# Patient Record
Sex: Female | Born: 1990 | Race: Black or African American | Hispanic: No | Smoking: Never smoker
Health system: Southern US, Community
[De-identification: ages and names within clinical notes are randomized; demographics above are authoritative.]

## PROBLEM LIST (undated history)

## (undated) ENCOUNTER — Emergency Department (HOSPITAL_BASED_OUTPATIENT_CLINIC_OR_DEPARTMENT_OTHER): Admission: EM | Payer: 59 | Source: Home / Self Care

## (undated) DIAGNOSIS — F419 Anxiety disorder, unspecified: Secondary | ICD-10-CM

## (undated) DIAGNOSIS — N730 Acute parametritis and pelvic cellulitis: Secondary | ICD-10-CM

## (undated) DIAGNOSIS — B009 Herpesviral infection, unspecified: Secondary | ICD-10-CM

## (undated) DIAGNOSIS — N83209 Unspecified ovarian cyst, unspecified side: Secondary | ICD-10-CM

## (undated) DIAGNOSIS — F32A Depression, unspecified: Secondary | ICD-10-CM

## (undated) HISTORY — PX: OVARIAN CYST SURGERY: SHX726

## (undated) HISTORY — PX: OOPHORECTOMY: SHX86

## (undated) HISTORY — PX: DILATION AND CURETTAGE OF UTERUS: SHX78

---

## 2021-02-23 ENCOUNTER — Other Ambulatory Visit: Payer: Self-pay

## 2021-02-23 ENCOUNTER — Encounter (HOSPITAL_BASED_OUTPATIENT_CLINIC_OR_DEPARTMENT_OTHER): Payer: Self-pay

## 2021-02-23 ENCOUNTER — Emergency Department (HOSPITAL_BASED_OUTPATIENT_CLINIC_OR_DEPARTMENT_OTHER)
Admission: EM | Admit: 2021-02-23 | Discharge: 2021-02-23 | Disposition: A | Discharge status: Home / Self Care | Payer: Medicaid Other | Attending: Emergency Medicine | Admitting: Emergency Medicine

## 2021-02-23 DIAGNOSIS — N760 Acute vaginitis: Secondary | ICD-10-CM | POA: Insufficient documentation

## 2021-02-23 DIAGNOSIS — T8332XA Displacement of intrauterine contraceptive device, initial encounter: Secondary | ICD-10-CM | POA: Diagnosis not present

## 2021-02-23 DIAGNOSIS — R102 Pelvic and perineal pain: Secondary | ICD-10-CM | POA: Diagnosis present

## 2021-02-23 HISTORY — DX: Unspecified ovarian cyst, unspecified side: N83.209

## 2021-02-23 HISTORY — DX: Acute parametritis and pelvic cellulitis: N73.0

## 2021-02-23 LAB — URINALYSIS, ROUTINE W REFLEX MICROSCOPIC
Bilirubin Urine: NEGATIVE
Glucose, UA: NEGATIVE mg/dL
Hgb urine dipstick: NEGATIVE
Ketones, ur: NEGATIVE mg/dL
Leukocytes,Ua: NEGATIVE
Nitrite: NEGATIVE
Protein, ur: NEGATIVE mg/dL
Specific Gravity, Urine: 1.015 (ref 1.005–1.030)
pH: 8 (ref 5.0–8.0)

## 2021-02-23 LAB — WET PREP, GENITAL
Clue Cells Wet Prep HPF POC: NONE SEEN
Sperm: NONE SEEN
Trich, Wet Prep: NONE SEEN
Yeast Wet Prep HPF POC: NONE SEEN

## 2021-02-23 LAB — PREGNANCY, URINE: Preg Test, Ur: NEGATIVE

## 2021-02-23 MED ORDER — KETOCONAZOLE 2 % EX CREA: TOPICAL_CREAM | CUTANEOUS | 0 refills | Status: AC

## 2021-02-23 MED ORDER — FLUCONAZOLE 150 MG PO TABS
150.0000 mg | ORAL_TABLET | Freq: Once | ORAL | Status: AC
  Administered 2021-02-23: 150 mg via ORAL
  Filled 2021-02-23: qty 1

## 2021-02-23 MED ORDER — METRONIDAZOLE 500 MG PO TABS: 500.0000 mg | ORAL_TABLET | Freq: Two times a day (BID) | ORAL | 0 refills | Status: DC

## 2021-02-23 NOTE — ED Provider Notes (Signed)
[facility] EMERGENCY DEPARTMENT Provider Note   CSN: [id number] Arrival date & time: 02/23/21  2010     History Chief Complaint  Patient presents with  . Vaginal Pain    Samantha Kent is a 30 y.o. female with a  Hx of PID, TOA  S/p oophorectomy who presents with vaginal pain. Patient states that  She and her partner were having intercourse yesterday. She noticed significant pain at the introitus especially when he removed his penis. She used a monistat insert today without relief. She denies pelvic pain, discharge or fever. She denies urinary sxs.  HPI     Past Medical History:  Diagnosis Date  . Ovarian cyst   . PID (acute pelvic inflammatory disease)     There are no problems to display for this patient.   Past Surgical History:  Procedure Laterality Date  . DILATION AND CURETTAGE OF UTERUS    . OOPHORECTOMY    . OVARIAN CYST SURGERY       OB History   No obstetric history on file.     No family history on file.  Social History   Tobacco Use  . Smoking status: Never Smoker  . Smokeless tobacco: Never Used  Substance Use Topics  . Alcohol use: Yes    Comment: occ  . Drug use: Never    Home Medications Prior to Admission medications   Medication Sig Start Date End Date Taking? Authorizing Provider  ketoconazole (NIZORAL) 2 % cream Apply topically to the vulva daily for 2 weeks 02/23/21  Yes Loran Fleet, PA-C  metroNIDAZOLE (FLAGYL) 500 MG tablet Take 1 tablet (500 mg total) by mouth 2 (two) times daily. 02/23/21  Yes Arthor Captain, PA-C    Allergies    Other  Review of Systems   Review of Systems Ten systems reviewed and are negative for acute change, except as noted in the HPI.   Physical Exam Updated Vital Signs BP (!) 113/59 (BP Location: Right Arm)   Pulse 88   Temp 98.4 F (36.9 C) (Oral)   Resp 16   Ht 5\' 4"  (1.626 m)   Wt 101.2 kg   SpO2 100%   BMI 38.30 kg/m   Physical Exam Vitals and nursing note reviewed. Exam  conducted with a chaperone present.  Constitutional:      General: She is not in acute distress.    Appearance: She is well-developed. She is not diaphoretic.  HENT:     Head: Normocephalic and atraumatic.  Eyes:     General: No scleral icterus.    Conjunctiva/sclera: Conjunctivae normal.  Cardiovascular:     Rate and Rhythm: Normal rate and regular rhythm.     Heart sounds: Normal heart sounds. No murmur heard. No friction rub. No gallop.   Pulmonary:     Effort: Pulmonary effort is normal. No respiratory distress.     Breath sounds: Normal breath sounds.  Abdominal:     General: Bowel sounds are normal. There is no distension.     Palpations: Abdomen is soft. There is no mass.     Tenderness: There is no abdominal tenderness. There is no guarding.  Genitourinary:    Exam position: Lithotomy position.     Labia:        Right: Tenderness present.        Left: Tenderness present.      Vagina: Vaginal discharge present.     Cervix: Discharge present. No cervical motion tenderness or cervical bleeding.  Adnexa: Right adnexa normal and left adnexa normal.     Comments: Irritation and swelling BL labia minora Introitus with swelling and erythema No visible strings in the cervix Musculoskeletal:     Cervical back: Normal range of motion.  Skin:    General: Skin is warm and dry.  Neurological:     Mental Status: She is alert and oriented to person, place, and time.  Psychiatric:        Behavior: Behavior normal.     ED Results / Procedures / Treatments   Labs (all labs ordered are listed, but only abnormal results are displayed) Labs Reviewed  WET PREP, GENITAL - Abnormal; Notable for the following components:      Result Value   WBC, Wet Prep HPF POC MANY (*)    All other components within normal limits  URINALYSIS, ROUTINE W REFLEX MICROSCOPIC - Abnormal; Notable for the following components:   APPearance CLOUDY (*)    All other components within normal limits   PREGNANCY, URINE  GC/CHLAMYDIA PROBE AMP ([place]) NOT AT [facility]    EKG None  Radiology No results found.  Procedures Procedures  Medications Ordered in ED Medications  fluconazole (DIFLUCAN) tablet 150 mg (has no administration in time range)    ED Course  I have reviewed the triage vital signs and the nursing notes.  Pertinent labs & imaging results that were available during my care of the patient were reviewed by me and considered in my medical decision making (see chart for details).    MDM Rules/Calculators/A&P                          Patient here with vaginitis. Will treat with diflucan, ketoconazole and flagyl. G/c chlamydia pending.  [place] ordered for IUD localization. Discussed return precautions and op f/u Final Clinical Impression(s) / ED Diagnoses Final diagnoses:  Acute vaginitis  Intrauterine contraceptive device threads lost, initial encounter    Rx / DC Orders ED Discharge Orders         Ordered    ketoconazole (NIZORAL) 2 % cream        02/23/21 2312    US Transvaginal Non-OB        02/23/21 2312    metroNIDAZOLE (FLAGYL) 500 MG tablet  2 times daily        02/23/21 2312           Arthor Captain, PA-C 02/23/21 2332    Terrilee Files, MD 02/24/21 1016

## 2021-02-23 NOTE — ED Triage Notes (Signed)
Pt c/o vaginal pain and swelling, painful intercourse-denies vaginal d/c-NAD-steady gait

## 2021-02-23 NOTE — Discharge Instructions (Signed)
You appear to have vaginitis. You will be treated for yeast vaginitis with a single dose of oral diflucan. 1 week of metronidazole (avoid alcohol with this medication) and topical ketoconazole cream. Please avoid inserting ANYTHING into you vagina for the next 2 weeks.  Please return tomorrow for an ultrasound to assess for you lost IUD strings. You may also call your OB gyn to have this done. Make sure to use a condom and back up birth control method until you have had your IUD located. You have been tested for gonorrhea and chlamydia as well. This result is a culture and takes 2 days to return. Contact a health care provider if: You have abdominal or pelvic pain. You have a fever or chills. You have symptoms that last for more than 2-3 days. Get help right away if: You have a fever and your symptoms suddenly get worse.

## 2021-02-24 ENCOUNTER — Ambulatory Visit (HOSPITAL_BASED_OUTPATIENT_CLINIC_OR_DEPARTMENT_OTHER)
Admission: RE | Admit: 2021-02-24 | Discharge: 2021-02-24 | Disposition: A | Discharge status: Home / Self Care | Payer: Medicaid Other | Source: Ambulatory Visit | Attending: Emergency Medicine | Admitting: Emergency Medicine

## 2021-02-24 DIAGNOSIS — Z30431 Encounter for routine checking of intrauterine contraceptive device: Secondary | ICD-10-CM | POA: Diagnosis not present

## 2021-02-27 LAB — GC/CHLAMYDIA PROBE AMP (~~LOC~~) NOT AT ARMC
Chlamydia: NEGATIVE
Comment: NEGATIVE
Comment: NORMAL
Neisseria Gonorrhea: NEGATIVE

## 2021-11-12 IMAGING — US US TRANSVAGINAL NON-OB
1 series · 14 of 25 positions shown · non-contrast
Comparison: None.

CLINICAL DATA: Missing IUD string.  Pelvic pressure.

EXAM:
ULTRASOUND PELVIS TRANSVAGINAL
TECHNIQUE: Transvaginal ultrasound examination of the pelvis was performed
including evaluation of the uterus, ovaries, adnexal regions, and
pelvic cul-de-sac.

[Series 1: us transvaginal non-ob · 39 acquisitions, 14 frames shown]
[im 1/39]
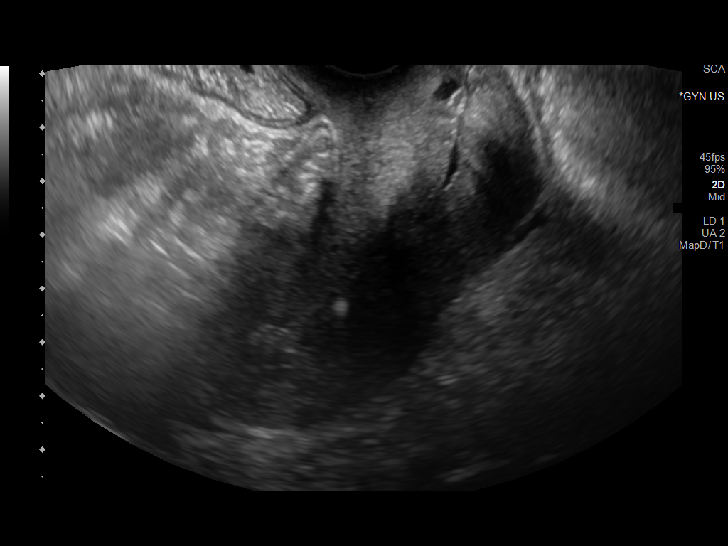
[im 4/39]
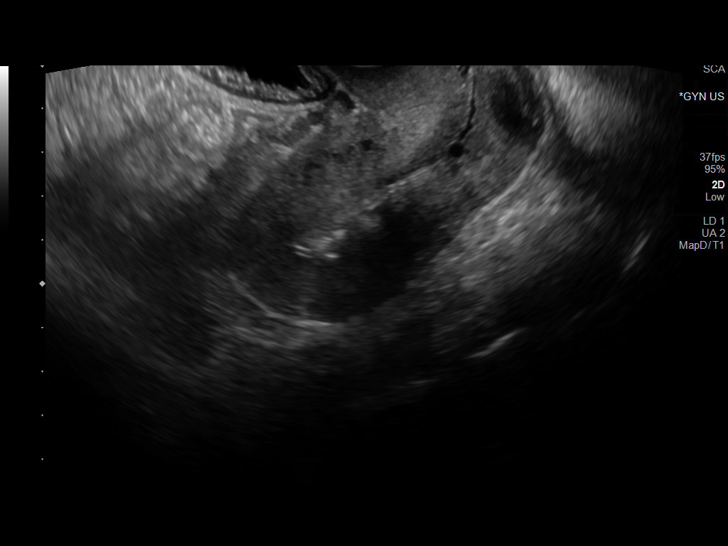
[im 7/39]
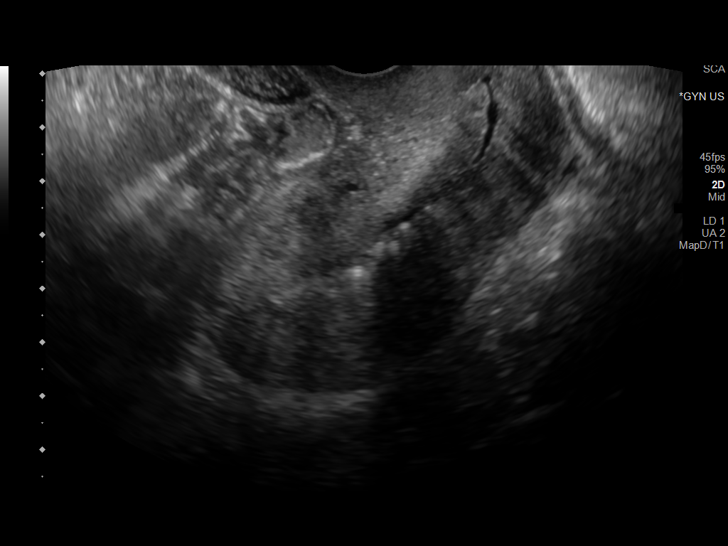
[im 10/39]
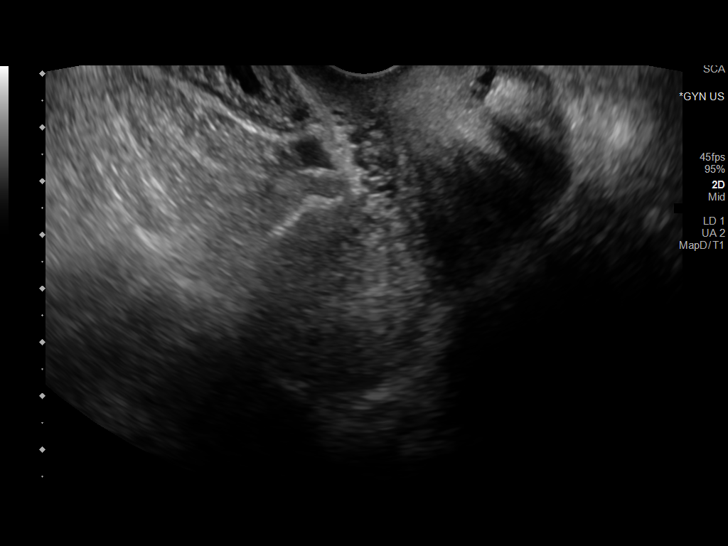
[im 13/39]
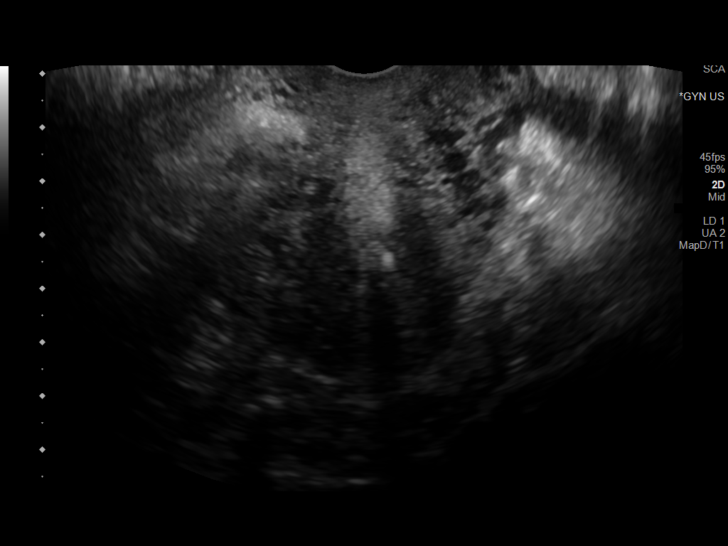
[im 15/39]
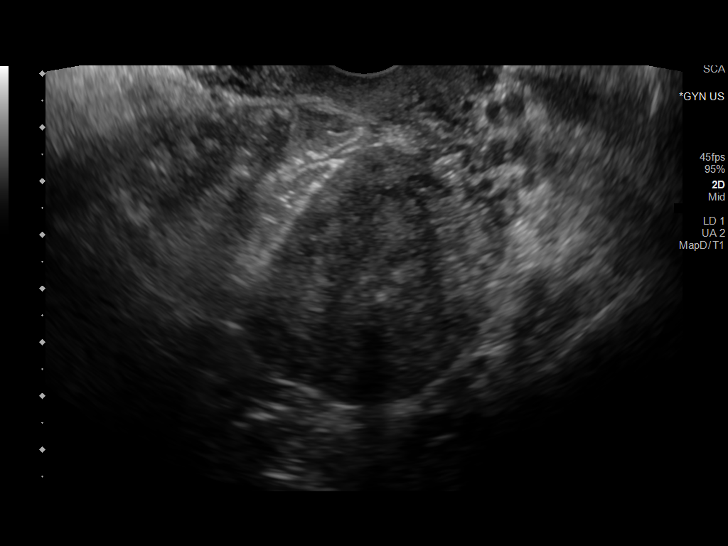
[im 18/39]
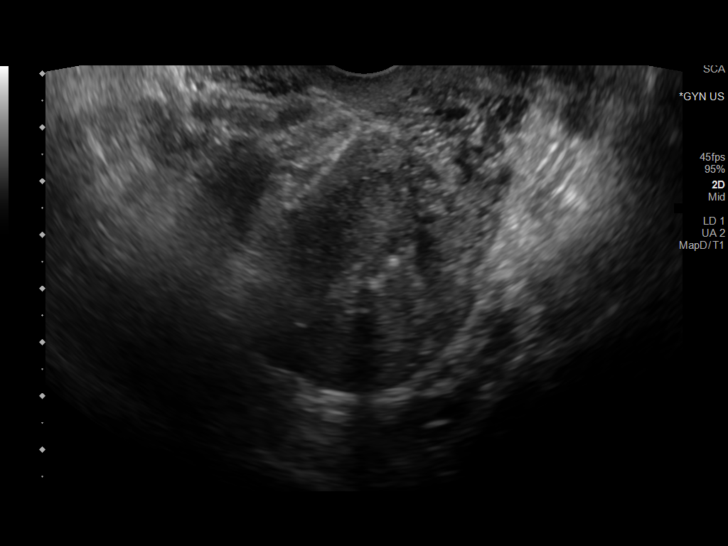
[im 21/39]
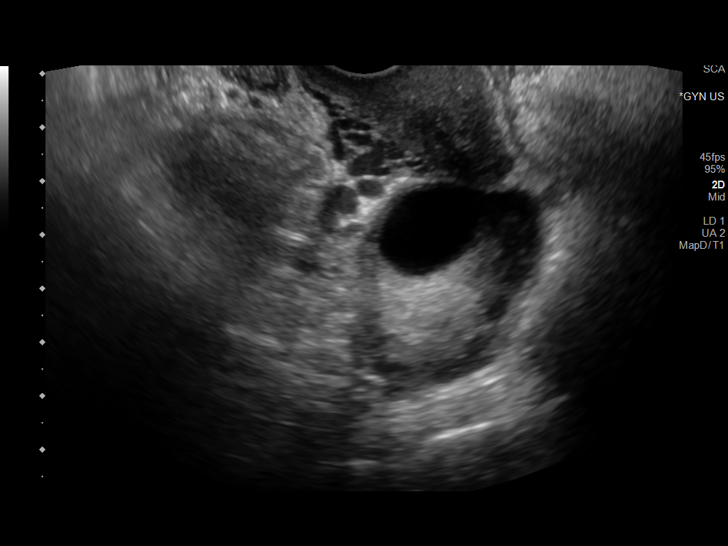
[im 24/39]
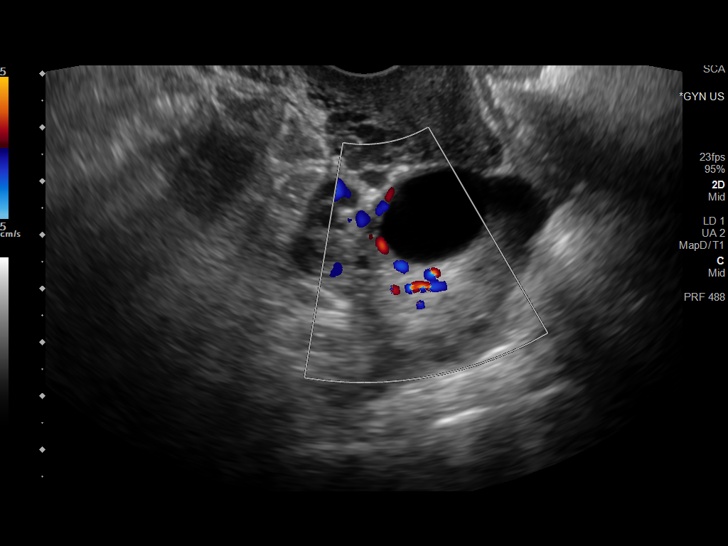
[im 26/39]
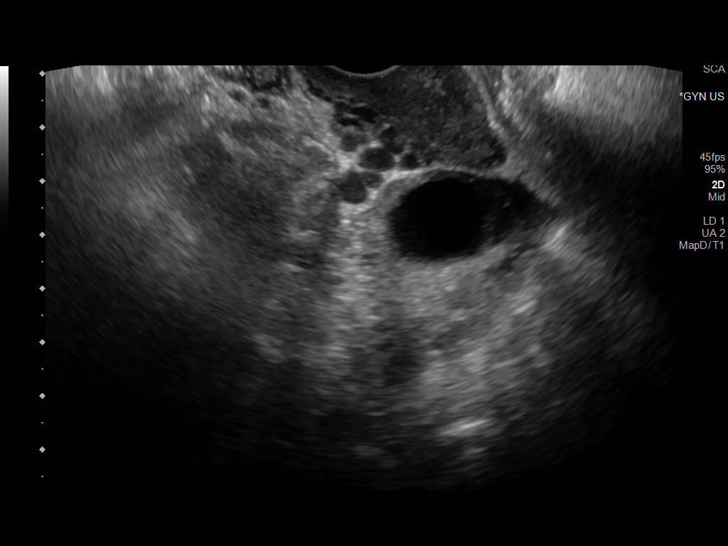
[im 29/39]
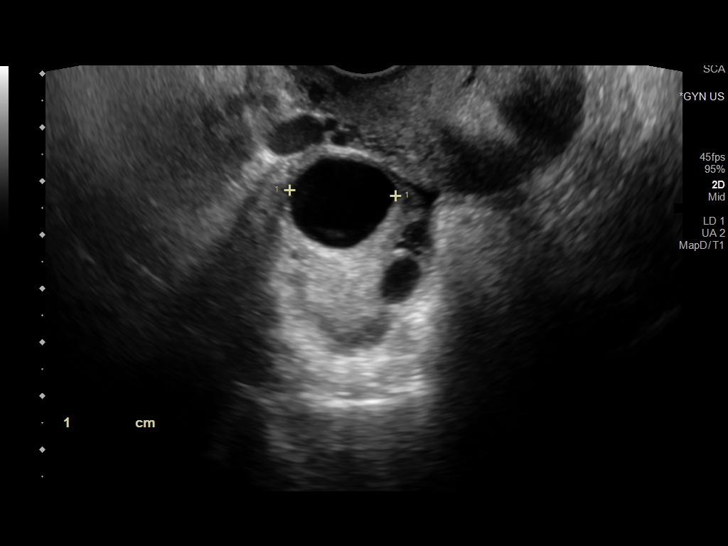
[im 32/39]
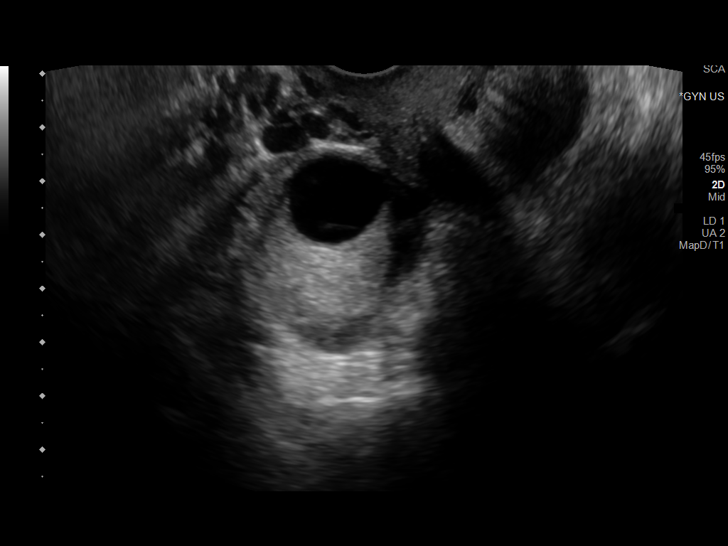
[im 35/39]
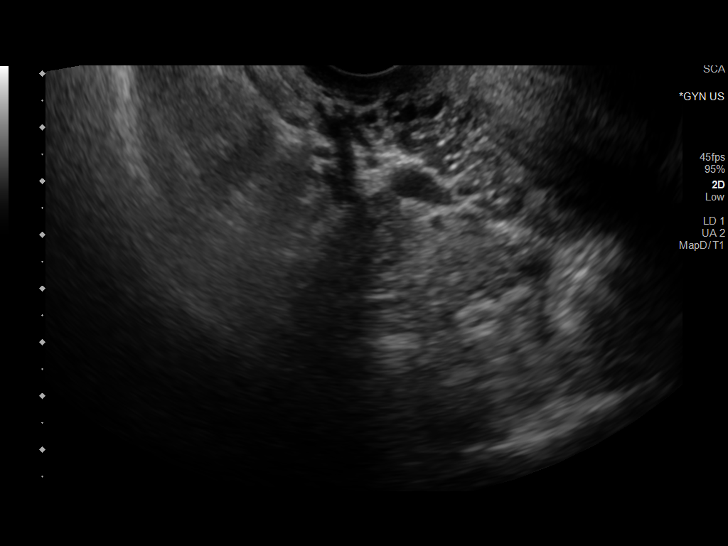
[im 39/39]
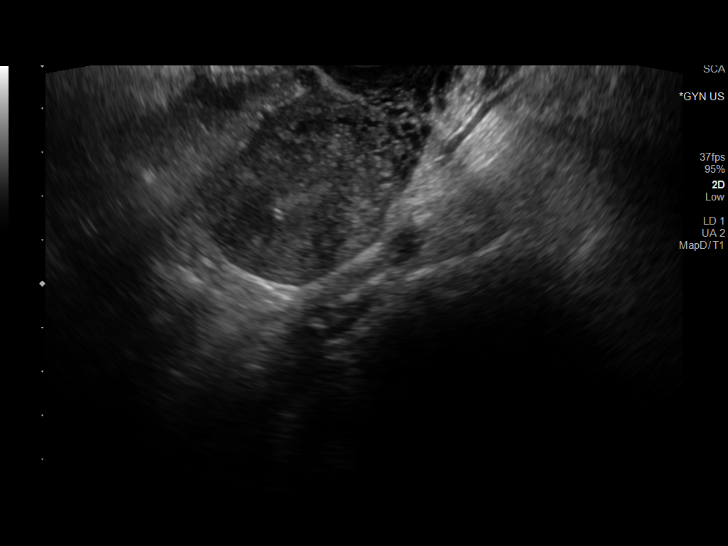

[14 of 25 positions shown; findings below may reference images not displayed]

FINDINGS: Uterus

Measurements: 8.1 x 4.0 x 4.5 cm = volume: 76.5 mL. No fibroids or
other mass visualized.

Endometrium

Thickness: 5 mm. The patient's IUD is identified within the
endometrial canal.

Right ovary

Measurements: 4.5 x 3.4 x 3.2 cm = volume: 25 mL. Normal
appearance/no adnexal mass.

Left ovary

Not visualized.

Other findings:  No abnormal free fluid
IMPRESSION: 1. The patient's IUD is identified within the endometrial canal.
2. The left ovary is not visualized.
3. No other abnormalities identified.

## 2023-09-30 ENCOUNTER — Emergency Department (HOSPITAL_BASED_OUTPATIENT_CLINIC_OR_DEPARTMENT_OTHER)
Admission: EM | Admit: 2023-09-30 | Discharge: 2023-09-30 | Disposition: A | Discharge status: Home / Self Care | Payer: 59 | Attending: Emergency Medicine | Admitting: Emergency Medicine

## 2023-09-30 ENCOUNTER — Other Ambulatory Visit (HOSPITAL_BASED_OUTPATIENT_CLINIC_OR_DEPARTMENT_OTHER): Payer: Self-pay

## 2023-09-30 ENCOUNTER — Other Ambulatory Visit: Payer: Self-pay

## 2023-09-30 DIAGNOSIS — T7421XA Adult sexual abuse, confirmed, initial encounter: Secondary | ICD-10-CM | POA: Diagnosis present

## 2023-09-30 DIAGNOSIS — S1195XA Open bite of unspecified part of neck, initial encounter: Secondary | ICD-10-CM | POA: Insufficient documentation

## 2023-09-30 DIAGNOSIS — S21159A Open bite of unspecified front wall of thorax without penetration into thoracic cavity, initial encounter: Secondary | ICD-10-CM | POA: Diagnosis not present

## 2023-09-30 LAB — URINALYSIS, ROUTINE W REFLEX MICROSCOPIC
Glucose, UA: NEGATIVE mg/dL
Ketones, ur: NEGATIVE mg/dL
Nitrite: NEGATIVE
Protein, ur: 30 mg/dL — AB
Specific Gravity, Urine: >=1.03 (ref 1.005–1.030)
pH: 5.5 (ref 5.0–8.0)

## 2023-09-30 LAB — URINALYSIS, MICROSCOPIC (REFLEX): Squamous Epithelial / HPF: >50 /[HPF] (ref 0–5)

## 2023-09-30 LAB — PREGNANCY, URINE: Preg Test, Ur: NEGATIVE

## 2023-09-30 MED ORDER — DOXYCYCLINE HYCLATE 100 MG PO CAPS
100.0000 mg | ORAL_CAPSULE | Freq: Two times a day (BID) | ORAL | 0 refills | Status: DC
  Filled 2023-09-30: qty 14, 7d supply, fill #0

## 2023-09-30 MED ORDER — METRONIDAZOLE 500 MG PO TABS
500.0000 mg | ORAL_TABLET | Freq: Two times a day (BID) | ORAL | 0 refills | Status: DC
  Filled 2023-09-30: qty 14, 7d supply, fill #0

## 2023-09-30 MED ORDER — METRONIDAZOLE 500 MG PO TABS
500.0000 mg | ORAL_TABLET | Freq: Once | ORAL | Status: AC
  Administered 2023-09-30: 500 mg via ORAL
  Filled 2023-09-30: qty 1

## 2023-09-30 MED ORDER — DOXYCYCLINE HYCLATE 100 MG PO TABS
100.0000 mg | ORAL_TABLET | Freq: Once | ORAL | Status: AC
  Administered 2023-09-30: 100 mg via ORAL
  Filled 2023-09-30: qty 1
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
## 2023-09-30 NOTE — ED Notes (Signed)
Pt again refused the rocephin shot
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
## 2023-09-30 NOTE — ED Notes (Signed)
Pt given infor and pamphlets per the SANE nurse

## 2023-10-07 ENCOUNTER — Other Ambulatory Visit (HOSPITAL_BASED_OUTPATIENT_CLINIC_OR_DEPARTMENT_OTHER): Payer: Self-pay

## 2023-10-07 ENCOUNTER — Encounter (HOSPITAL_BASED_OUTPATIENT_CLINIC_OR_DEPARTMENT_OTHER): Payer: Self-pay

## 2023-10-07 ENCOUNTER — Emergency Department (HOSPITAL_BASED_OUTPATIENT_CLINIC_OR_DEPARTMENT_OTHER)
Admission: EM | Admit: 2023-10-07 | Discharge: 2023-10-07 | Disposition: A | Discharge status: Home / Self Care | Payer: 59 | Attending: Emergency Medicine | Admitting: Emergency Medicine

## 2023-10-07 DIAGNOSIS — X58XXXA Exposure to other specified factors, initial encounter: Secondary | ICD-10-CM | POA: Diagnosis not present

## 2023-10-07 DIAGNOSIS — S30202A Contusion of unspecified external genital organ, female, initial encounter: Secondary | ICD-10-CM | POA: Insufficient documentation

## 2023-10-07 LAB — WET PREP, GENITAL
Clue Cells Wet Prep HPF POC: NONE SEEN
Sperm: NONE SEEN
Trich, Wet Prep: NONE SEEN
WBC, Wet Prep HPF POC: <10 (ref <10)

## 2023-10-07 LAB — HIV ANTIBODY (ROUTINE TESTING W REFLEX): HIV Screen 4th Generation wRfx: NONREACTIVE

## 2023-10-07 LAB — RPR: RPR Ser Ql: NONREACTIVE

## 2023-10-07 MED ORDER — DOXYCYCLINE HYCLATE 100 MG PO CAPS: 100.0000 mg | ORAL_CAPSULE | Freq: Two times a day (BID) | ORAL | 0 refills | Status: AC

## 2023-10-07 MED ORDER — KETOCONAZOLE 200 MG PO TABS: ORAL_TABLET | ORAL | 0 refills | Status: AC

## 2023-10-07 MED ORDER — METRONIDAZOLE 500 MG PO TABS: 500.0000 mg | ORAL_TABLET | Freq: Two times a day (BID) | ORAL | 0 refills | Status: DC

## 2023-10-07 MED ORDER — CEFTRIAXONE SODIUM 500 MG IJ SOLR
500.0000 mg | Freq: Once | INTRAMUSCULAR | Status: AC
  Administered 2023-10-07: 500 mg via INTRAMUSCULAR
  Filled 2023-10-07: qty 500
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
## 2023-10-07 NOTE — Discharge Instructions (Signed)
You were seen today for genital pain.  Thank you for allowing [place] to be part of your care.  There are no obvious cuts or hematomas that need to be repaired or drained at this time.  Due to your abdominal pain and exam findings we think it is a good idea cover for pelvic inflammatory disease with antibiotics.  Please go ahead and pick up those medications from the pharmacy and take them as prescribed.  You also have some yeast on your wet prep, which we will treat for ketoconazole.  Please try ibuprofen and/or Tylenol for your pain. If you have any worsening pain, discharge, bleeding, fevers, chills, nausea, vomiting, or abdominal pain please seek medical attention.

## 2023-10-09 LAB — GC/CHLAMYDIA PROBE AMP (~~LOC~~) NOT AT ARMC
Chlamydia: NEGATIVE
Comment: NEGATIVE
Comment: NORMAL
Neisseria Gonorrhea: NEGATIVE

## 2023-12-09 ENCOUNTER — Other Ambulatory Visit: Payer: Self-pay

## 2024-04-25 ENCOUNTER — Other Ambulatory Visit: Payer: Self-pay

## 2024-04-25 ENCOUNTER — Emergency Department (HOSPITAL_BASED_OUTPATIENT_CLINIC_OR_DEPARTMENT_OTHER): Admission: EM | Admit: 2024-04-25 | Discharge: 2024-04-25 | Disposition: A | Discharge status: Home / Self Care

## 2024-04-25 ENCOUNTER — Encounter (HOSPITAL_BASED_OUTPATIENT_CLINIC_OR_DEPARTMENT_OTHER): Payer: Self-pay

## 2024-04-25 DIAGNOSIS — N760 Acute vaginitis: Secondary | ICD-10-CM | POA: Diagnosis not present

## 2024-04-25 DIAGNOSIS — B3731 Acute candidiasis of vulva and vagina: Secondary | ICD-10-CM | POA: Diagnosis not present

## 2024-04-25 DIAGNOSIS — B9689 Other specified bacterial agents as the cause of diseases classified elsewhere: Secondary | ICD-10-CM | POA: Insufficient documentation

## 2024-04-25 DIAGNOSIS — Z202 Contact with and (suspected) exposure to infections with a predominantly sexual mode of transmission: Secondary | ICD-10-CM | POA: Diagnosis present

## 2024-04-25 HISTORY — DX: Herpesviral infection, unspecified: B00.9

## 2024-04-25 HISTORY — DX: Anxiety disorder, unspecified: F41.9

## 2024-04-25 HISTORY — DX: Depression, unspecified: F32.A

## 2024-04-25 LAB — URINALYSIS, MICROSCOPIC (REFLEX): WBC, UA: NONE SEEN WBC/hpf (ref 0–5)

## 2024-04-25 LAB — URINALYSIS, ROUTINE W REFLEX MICROSCOPIC
Bilirubin Urine: NEGATIVE
Glucose, UA: NEGATIVE mg/dL
Ketones, ur: NEGATIVE mg/dL
Leukocytes,Ua: NEGATIVE
Nitrite: NEGATIVE
Protein, ur: 30 mg/dL — AB
Specific Gravity, Urine: >=1.03 (ref 1.005–1.030)
pH: 6 (ref 5.0–8.0)

## 2024-04-25 LAB — RAPID HIV SCREEN (HIV 1/2 AB+AG)
HIV 1/2 Antibodies: NONREACTIVE
HIV-1 P24 Antigen - HIV24: NONREACTIVE

## 2024-04-25 LAB — WET PREP, GENITAL
Sperm: NONE SEEN
Trich, Wet Prep: NONE SEEN
WBC, Wet Prep HPF POC: <10 (ref <10)

## 2024-04-25 LAB — PREGNANCY, URINE: Preg Test, Ur: NEGATIVE

## 2024-04-25 MED ORDER — METRONIDAZOLE 500 MG PO TABS
500.0000 mg | ORAL_TABLET | Freq: Once | ORAL | Status: AC
  Administered 2024-04-25: 500 mg via ORAL
  Filled 2024-04-25: qty 1

## 2024-04-25 MED ORDER — METRONIDAZOLE 500 MG PO TABS: 500.0000 mg | ORAL_TABLET | Freq: Two times a day (BID) | ORAL | 0 refills | Status: AC

## 2024-04-25 MED ORDER — METRONIDAZOLE 500 MG PO TABS: 500.0000 mg | ORAL_TABLET | Freq: Two times a day (BID) | ORAL | 0 refills | Status: DC

## 2024-04-25 MED ORDER — FLUCONAZOLE 150 MG PO TABS: 150.0000 mg | ORAL_TABLET | Freq: Once | ORAL | 0 refills | Status: AC

## 2024-04-25 MED ORDER — FLUCONAZOLE 150 MG PO TABS: 150.0000 mg | ORAL_TABLET | Freq: Once | ORAL | 0 refills | Status: DC

## 2024-04-25 MED ORDER — FLUCONAZOLE 150 MG PO TABS
150.0000 mg | ORAL_TABLET | Freq: Once | ORAL | Status: AC
  Administered 2024-04-25: 150 mg via ORAL
  Filled 2024-04-25: qty 1

## 2024-04-25 NOTE — Discharge Instructions (Signed)
 Please take the Flagyl  as prescribed.  If you are having any persistent discharge or symptoms consistent with yeast infection after taking the Flagyl  take the Diflucan .  You did get a dose of this here and most of the time 1 dose is enough to treat this.  Please follow-up with your doctor and return to the ER for worsening symptoms.  Your primary care doctor may want to recheck the HIV testing at a later date as well even if the first round of testing is negative.
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
## 2024-04-26 LAB — RPR: RPR Ser Ql: NONREACTIVE

## 2024-04-27 LAB — GC/CHLAMYDIA PROBE AMP (~~LOC~~) NOT AT ARMC
Chlamydia: NEGATIVE
Comment: NEGATIVE
Comment: NORMAL
Neisseria Gonorrhea: NEGATIVE

## 2024-07-17 ENCOUNTER — Encounter (HOSPITAL_BASED_OUTPATIENT_CLINIC_OR_DEPARTMENT_OTHER): Payer: Self-pay

## 2024-07-17 ENCOUNTER — Emergency Department (HOSPITAL_BASED_OUTPATIENT_CLINIC_OR_DEPARTMENT_OTHER): Admission: EM | Admit: 2024-07-17 | Discharge: 2024-07-17 | Disposition: A | Discharge status: Home / Self Care

## 2024-07-17 ENCOUNTER — Other Ambulatory Visit: Payer: Self-pay

## 2024-07-17 DIAGNOSIS — T7840XA Allergy, unspecified, initial encounter: Secondary | ICD-10-CM

## 2024-07-17 DIAGNOSIS — L500 Allergic urticaria: Secondary | ICD-10-CM | POA: Diagnosis not present

## 2024-07-17 MED ORDER — ZYRTEC ALLERGY 10 MG PO TBDP: 1.0000 | ORAL_TABLET | Freq: Every day | ORAL | 0 refills | Status: DC

## 2024-07-17 MED ORDER — DIPHENHYDRAMINE HCL 50 MG/ML IJ SOLN
25.0000 mg | Freq: Once | INTRAMUSCULAR | Status: AC
  Administered 2024-07-17: 25 mg via INTRAVENOUS
  Filled 2024-07-17: qty 1

## 2024-07-17 MED ORDER — METHYLPREDNISOLONE SODIUM SUCC 125 MG IJ SOLR
125.0000 mg | Freq: Once | INTRAMUSCULAR | Status: AC
  Administered 2024-07-17: 125 mg via INTRAVENOUS
  Filled 2024-07-17: qty 2

## 2024-07-17 MED ORDER — EPINEPHRINE 0.3 MG/0.3ML IJ SOAJ
0.3000 mg | INTRAMUSCULAR | 0 refills | Status: AC | PRN
Start: 2024-07-17 — End: ?

## 2024-07-17 MED ORDER — PREDNISONE 20 MG PO TABS: 40.0000 mg | ORAL_TABLET | Freq: Every day | ORAL | 0 refills | Status: AC

## 2024-07-17 MED ORDER — FAMOTIDINE IN NACL 20-0.9 MG/50ML-% IV SOLN
20.0000 mg | Freq: Once | INTRAVENOUS | Status: AC
  Administered 2024-07-17: 20 mg via INTRAVENOUS
  Filled 2024-07-17: qty 50

## 2024-07-17 NOTE — Discharge Instructions (Addendum)
 As discussed, we will send you home with an EpiPen  to use if symptoms concerning for anaphylaxis arise.  Will also send him with allergy  medicine as well as the steroids.  Recommend follow-up with primary care for reassessment.

## 2024-07-17 NOTE — ED Provider Notes (Signed)
 [place] EMERGENCY DEPARTMENT AT [facility] [place] Provider Note   CSN: [id number] Arrival date & time: 07/17/24  1814     Patient presents with: Allergic Reaction   Samantha Kent is a 33 y.o. female.    Allergic Reaction   33 year old female presents emergency department with concern for allergic reaction.  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  Her son had a cherry drink in the car and feels like she got exposed to it although she did not consume it.  Reports having swelling above her eyes, rash on her abdomen, upper extremities that is very itchy.  States it feels similar to other allergic reactions she has had in the past.  Did take a Claritin which did not help prompting visit to the emergency department.  Denies any feelings of throat closing on her, tongue/lip swelling, chest pain, shortness of breath, abdominal pain.  Past medical history significant for PID, HSV 1, ovarian cyst  Prior to Admission medications   Medication Sig Start Date End Date Taking? Authorizing Provider  ketoconazole  (NIZORAL ) 2 % cream Apply topically to the vulva daily for 2 weeks 02/23/21   Harris, Abigail, PA-C  ketoconazole  (NIZORAL ) 200 MG tablet Take one tablet by mouth and one in 24 hours if you symptoms fail to resolve. 10/07/23   Gabino Boga, MD  metroNIDAZOLE  (FLAGYL ) 500 MG tablet Take 1 tablet (500 mg total) by mouth 2 (two) times daily. 04/25/24   Ula Prentice SAUNDERS, MD    Allergies: Other and Cherry    Review of Systems  All other systems reviewed and are negative.   Updated Vital Signs BP 136/83   Pulse 92   Temp (!) 97.4 F (36.3 C)   Resp 18   Ht 5' 4 (1.626 m)   Wt 97.5 kg   SpO2 100%   BMI 36.90 kg/m   Physical Exam Vitals and nursing note reviewed.  Constitutional:      General: She is not in acute distress.    Appearance: She is well-developed.  HENT:     Head: Normocephalic and atraumatic.  Eyes:      Conjunctiva/sclera: Conjunctivae normal.  Cardiovascular:     Rate and Rhythm: Normal rate and regular rhythm.     Heart sounds: No murmur heard. Pulmonary:     Effort: Pulmonary effort is normal. No respiratory distress.     Breath sounds: Normal breath sounds. No stridor. No wheezing, rhonchi or rales.  Abdominal:     Palpations: Abdomen is soft.     Tenderness: There is no abdominal tenderness.  Musculoskeletal:        General: No swelling.     Cervical back: Neck supple.  Skin:    General: Skin is warm and dry.     Capillary Refill: Capillary refill takes less than 2 seconds.     Comments: Urticarial rash appreciated lower abdomen, bilateral upper extremities.  Neurological:     Mental Status: She is alert.  Psychiatric:        Mood and Affect: Mood normal.     (all labs ordered are listed, but only abnormal results are displayed) Labs Reviewed - No data to display  EKG: None  Radiology: No results found.   Procedures   Medications Ordered in the ED  famotidine  (PEPCID ) IVPB 20 mg premix (20 mg Intravenous New Bag/Given 07/17/24 1907)  methylPREDNISolone  sodium succinate (SOLU-MEDROL ) 125 mg/2 mL injection 125 mg (125 mg Intravenous Given 07/17/24 1903)  diphenhydrAMINE  (BENADRYL ) injection 25 mg (25 mg Intravenous Given 07/17/24 1904)                                    Medical Decision Making Risk OTC drugs. Prescription drug management.   This patient presents to the ED for concern of allergic reaction, this involves an extensive number of treatment options, and is a complaint that carries with it a high risk of complications and morbidity.  The differential diagnosis includes angioedema, anaphylaxis, allergic reaction, other   Co morbidities that complicate the patient evaluation  See HPI   Additional history obtained:  Additional history obtained from EMR External records from outside source obtained and reviewed including hospital records   Lab  Tests:  N/a   Imaging Studies ordered:  N/a   Cardiac Monitoring: / EKG:  N/a   Consultations Obtained:  N/a   Problem List / ED Course / Critical interventions / Medication management  Allergic reaction I ordered medication including Medrol , Benadryl , Pepcid    Reevaluation of the patient after these medicines showed that the patient stayed the same I have reviewed the patients home medicines and have made adjustments as needed   Social Determinants of Health:  Denies tobacco, illicit drug use.   Test / Admission - Considered:  Allergic reaction Vitals signs within normal range and stable throughout visit. 33 year old female presents emergency department with concern for allergic reaction.  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  Her son had a cherry drink in the car and feels like she got exposed to it although she did not consume it.  Reports having swelling above her eyes, rash on her abdomen, upper extremities that is very itchy.  States it feels similar to other allergic reactions she has had in the past.  Did take a Claritin which did not help prompting visit to the emergency department.  Denies any feelings of throat closing on her, tongue/lip swelling, chest pain, shortness of breath, abdominal pain. On exam, urticarial rash appreciated abdomen, arms above eyelids.  No evidence clinically of anaphylaxis/angioedema.  Treated with antihistamines, corticosteroid in the emergency department notes significant improvement of symptoms.  Observed for nearly 2-1/2 hours.  Patient desiring to go home to observe her symptoms at home.  This is reasonable given her improvement in the emergency department.  Will send home with EpiPen  to use if needed, recommend further symptomatic therapy as an AVS and follow-up with primary care in the outpatient setting.  Treatment plan discussed with patient she is understanding is agreeable.  Patient  well-appearing, afebrile in no acute distress. Worrisome signs and symptoms were discussed with the patient, and the patient acknowledged understanding to return to the ED if noticed. Patient was stable upon discharge.       Final diagnoses:  None    ED Discharge Orders     None          Silver Wonda LABOR, [place] 07/17/24 2026    Simon Lavonia SAILOR, MD 07/17/24 [phone]

## 2024-07-17 NOTE — ED Triage Notes (Signed)
 Arrives POV with complaint of having a suspected allergic reaction to cherries. Patients reports that her child had a cherry drink and she was in close proximity. Now feeling like she is becoming swollen all over with skin sensitivity.  No oral swelling or shortness of breath.

## 2024-11-07 ENCOUNTER — Encounter (HOSPITAL_BASED_OUTPATIENT_CLINIC_OR_DEPARTMENT_OTHER): Payer: Self-pay | Admitting: Emergency Medicine

## 2024-11-07 ENCOUNTER — Emergency Department (HOSPITAL_BASED_OUTPATIENT_CLINIC_OR_DEPARTMENT_OTHER)
Admission: EM | Admit: 2024-11-07 | Discharge: 2024-11-07 | Disposition: A | Discharge status: Home / Self Care | Attending: Emergency Medicine | Admitting: Emergency Medicine

## 2024-11-07 DIAGNOSIS — H02844 Edema of left upper eyelid: Secondary | ICD-10-CM | POA: Insufficient documentation

## 2024-11-07 DIAGNOSIS — L509 Urticaria, unspecified: Secondary | ICD-10-CM | POA: Diagnosis present

## 2024-11-07 DIAGNOSIS — H02841 Edema of right upper eyelid: Secondary | ICD-10-CM | POA: Diagnosis not present

## 2024-11-07 MED ORDER — PREDNISONE 20 MG PO TABS: 60.0000 mg | ORAL_TABLET | Freq: Every day | ORAL | 0 refills | Status: AC

## 2024-11-07 MED ORDER — FAMOTIDINE 20 MG PO TABS
20.0000 mg | ORAL_TABLET | Freq: Once | ORAL | Status: AC
  Administered 2024-11-07: 20 mg via ORAL
  Filled 2024-11-07: qty 1

## 2024-11-07 MED ORDER — METHYLPREDNISOLONE SODIUM SUCC 125 MG IJ SOLR
125.0000 mg | Freq: Once | INTRAMUSCULAR | Status: AC
  Administered 2024-11-07: 125 mg via INTRAMUSCULAR
  Filled 2024-11-07: qty 2

## 2024-11-07 MED ORDER — LORATADINE 10 MG PO TABS: 10.0000 mg | ORAL_TABLET | Freq: Every day | ORAL | 0 refills | Status: AC

## 2024-11-07 MED ORDER — FAMOTIDINE 20 MG PO TABS: 20.0000 mg | ORAL_TABLET | Freq: Every day | ORAL | 0 refills | Status: AC

## 2024-11-07 NOTE — ED Triage Notes (Signed)
 Pt state hive and swelling, states unknown cause.

## 2024-11-07 NOTE — ED Provider Notes (Signed)
 "  EMERGENCY DEPARTMENT AT [facility] Provider Note   CSN: [id number] Arrival date & time: 11/07/24  9479     Patient presents with: Urticaria   Samantha Kent is a 34 y.o. female.   34 year old female presents today for concern of hives, and swelling to her eyelids that started last night.  She is unsure of what triggered it but she is allergic to cherries and strawberries even artificial flavoring of the same foods or scents.  She states her son did have a strawberry drink yesterday.  She has seen an allergy  previously.  She states when she woke up this morning she noticed the swelling to her eyelid.  This was 4 AM.  She has been emergency department since 5 AM.  Denies any chest pain, nausea, difficulty breathing, or throat swelling.  She does have an EpiPen  at home.  Did not use the EpiPen  for this episode.  The history is provided by the patient. No language interpreter was used.       Prior to Admission medications  Medication Sig Start Date End Date Taking? Authorizing Provider  famotidine  (PEPCID ) 20 MG tablet Take 1 tablet (20 mg total) by mouth daily. 11/07/24 11/12/24 Yes Myli Pae, PA-C  loratadine  (CLARITIN ) 10 MG tablet Take 1 tablet (10 mg total) by mouth daily. 11/07/24 11/12/24 Yes Bertine Schlottman, PA-C  predniSONE  (DELTASONE ) 20 MG tablet Take 3 tablets (60 mg total) by mouth daily with breakfast for 4 days. 11/07/24 11/11/24 Yes Kennidee Heyne, PA-C  EPINEPHrine  0.3 mg/0.3 mL IJ SOAJ injection Inject 0.3 mg into the muscle as needed for anaphylaxis. 07/17/24   Silver Wonda LABOR, PA  ketoconazole  (NIZORAL ) 2 % cream Apply topically to the vulva daily for 2 weeks 02/23/21   Harris, Abigail, PA-C  ketoconazole  (NIZORAL ) 200 MG tablet Take one tablet by mouth and one in 24 hours if you symptoms fail to resolve. 10/07/23   Gabino Boga, MD  metroNIDAZOLE  (FLAGYL ) 500 MG tablet Take 1 tablet (500 mg total) by mouth 2 (two) times daily. 04/25/24   Ula Prentice SAUNDERS, MD     Allergies: Other and Cherry    Review of Systems  Constitutional:  Negative for chills and fever.  HENT:  Negative for trouble swallowing.   Respiratory:  Negative for shortness of breath.   Skin:  Positive for rash (Urticaria).  All other systems reviewed and are negative.   Updated Vital Signs BP 135/84   Pulse 78   Temp 99 F (37.2 C)   Resp 20   Ht 5' 3 (1.6 m)   Wt 102.1 kg   SpO2 100%   BMI 39.86 kg/m   Physical Exam Vitals and nursing note reviewed.  Constitutional:      General: She is not in acute distress.    Appearance: Normal appearance. She is not ill-appearing.  HENT:     Head: Normocephalic and atraumatic.     Nose: Nose normal.     Mouth/Throat:     Comments: Oral mucosa is normal.  No tongue swelling.  Normal speech. Eyes:     Extraocular Movements: Extraocular movements intact.     Conjunctiva/sclera: Conjunctivae normal.     Comments: Edema noted to bilateral upper eyelids worse on the right side.  Vision grossly intact.  No drainage.  Without conjunctivitis.  Cardiovascular:     Rate and Rhythm: Normal rate.  Pulmonary:     Effort: Pulmonary effort is normal. No respiratory distress.     Breath  sounds: Normal breath sounds. No wheezing.  Musculoskeletal:        General: No deformity.  Skin:    Findings: Rash present.     Comments: Generalized urticaria on trunk, bilateral upper extremities  Neurological:     Mental Status: She is alert.     (all labs ordered are listed, but only abnormal results are displayed) Labs Reviewed - No data to display  EKG: None  Radiology: No results found.   Procedures   Medications Ordered in the ED  methylPREDNISolone  sodium succinate (SOLU-MEDROL ) 125 mg/2 mL injection 125 mg (has no administration in time range)  famotidine  (PEPCID ) tablet 20 mg (has no administration in time range)                                    Medical Decision Making Risk OTC drugs. Prescription drug  management.   Medical Decision Making / ED Course   This patient presents to the ED for concern of allergic reaction, this involves an extensive number of treatment options, and is a complaint that carries with it a high risk of complications and morbidity.  The differential diagnosis includes allergic reaction, urticaria, anaphylactic reaction  MDM: 34 year old female presents today for concern of allergic reaction.  This started last night.  This morning she noticed her eyes were swollen.  She does not have any throat swelling, difficulty breathing, nausea.  Her symptoms are limited to her eyelid swelling and rash.  She does not have any tongue swelling on exam.  Will provide Solu-Medrol , Pepcid .  She will take antihistamine when she gets home.  She took 2 doses of antihistamine last night.  Patient is appropriate for discharge.  Discharged in stable condition.  Strict return precautions discussed.  No evidence of anaphylactic reaction.  Patient voices understanding and is in agreement with plan.  Patient was observed in the emergency department for total of 4 hours.  No progression of her symptoms.  No evidence of anaphylactic reaction.   Additional history obtained: -Additional history obtained from chart review -External records from outside source obtained and reviewed including: Chart review including previous notes, labs, imaging, consultation notes   Lab Tests: -I ordered, reviewed, and interpreted labs.   The pertinent results include:   Labs Reviewed - No data to display    EKG  EKG Interpretation Date/Time:    Ventricular Rate:    PR Interval:    QRS Duration:    QT Interval:    QTC Calculation:   R Axis:      Text Interpretation:          Medicines ordered and prescription drug management: Meds ordered this encounter  Medications   methylPREDNISolone  sodium succinate (SOLU-MEDROL ) 125 mg/2 mL injection 125 mg    IV methylprednisolone  will be converted  to either a q12h or q24h frequency with the same total daily dose (TDD).  Ordered Dose: 1 to 125 mg TDD; convert to: TDD q24h.  Ordered Dose: 126 to 250 mg TDD; convert to: TDD div q12h.  Ordered Dose: >250 mg TDD; DAW.   famotidine  (PEPCID ) tablet 20 mg   predniSONE  (DELTASONE ) 20 MG tablet    Sig: Take 3 tablets (60 mg total) by mouth daily with breakfast for 4 days.    Dispense:  12 tablet    Refill:  0    Supervising Provider:   CLEOTILDE ROGUE [[id number]]   famotidine  (PEPCID ) 20  MG tablet    Sig: Take 1 tablet (20 mg total) by mouth daily.    Dispense:  5 tablet    Refill:  0    Supervising Provider:   MILLER, BRIAN [[id number]]   loratadine  (CLARITIN ) 10 MG tablet    Sig: Take 1 tablet (10 mg total) by mouth daily.    Dispense:  5 tablet    Refill:  0    Supervising Provider:   CLEOTILDE, BRIAN [[id number]]    -I have reviewed the patients home medicines and have made adjustments as needed   Reevaluation: After the interventions noted above, I reevaluated the patient and found that they have :No progression of her symptoms for the 4 hours she was observed in the emergency department.  Co morbidities that complicate the patient evaluation  Past Medical History:  Diagnosis Date   Anxiety    Depression    Herpes simplex type 1 infection    Ovarian cyst    PID (acute pelvic inflammatory disease)       Dispostion: Discharged in stable condition.  Return precaution discussed.  Patient voices understanding and is in agreement with plan.  Final diagnoses:  Urticaria    ED Discharge Orders          Ordered    predniSONE  (DELTASONE ) 20 MG tablet  Daily with breakfast        11/07/24 0920    famotidine  (PEPCID ) 20 MG tablet  Daily        11/07/24 0920    loratadine  (CLARITIN ) 10 MG tablet  Daily        11/07/24 0920               Hildegard Loge, PA-C 11/07/24 0930    Rogelia Jerilynn RAMAN, MD 11/07/24 [phone]  "

## 2024-11-07 NOTE — Discharge Instructions (Addendum)
 He received a dose of steroid and Pepcid  in the emergency department.  Continue taking the prednisone  at home starting tomorrow along with Pepcid  and Claritin .  You can take the Claritin  once you get home.  If your symptoms get worse please return to the emergency room.  Specially if you develop difficulty breathing or throat swelling.  You also have an EpiPen  at home already.  If you notice throat swelling, difficulty breathing please use the EpiPen  and then return to the emergency department.  Otherwise follow-up with your primary care doctor.

## 2024-11-07 NOTE — ED Notes (Signed)
 Patient's bilateral breath sounds are clear.  No evidence of stridor at this time.
